# Patient Record
Sex: Female | Born: 1985 | Race: White | Hispanic: No | Marital: Single | Smoking: Current every day smoker
Health system: Southern US, Community
[De-identification: ages and names within clinical notes are randomized; demographics above are authoritative.]

## PROBLEM LIST (undated history)

## (undated) DIAGNOSIS — F4310 Post-traumatic stress disorder, unspecified: Secondary | ICD-10-CM

## (undated) DIAGNOSIS — F419 Anxiety disorder, unspecified: Secondary | ICD-10-CM

## (undated) DIAGNOSIS — F609 Personality disorder, unspecified: Secondary | ICD-10-CM

## (undated) DIAGNOSIS — F329 Major depressive disorder, single episode, unspecified: Secondary | ICD-10-CM

## (undated) DIAGNOSIS — F909 Attention-deficit hyperactivity disorder, unspecified type: Secondary | ICD-10-CM

## (undated) DIAGNOSIS — F319 Bipolar disorder, unspecified: Secondary | ICD-10-CM

## (undated) DIAGNOSIS — F32A Depression, unspecified: Secondary | ICD-10-CM

## (undated) DIAGNOSIS — E119 Type 2 diabetes mellitus without complications: Secondary | ICD-10-CM

## (undated) DIAGNOSIS — F209 Schizophrenia, unspecified: Secondary | ICD-10-CM

## (undated) DIAGNOSIS — R569 Unspecified convulsions: Secondary | ICD-10-CM

## (undated) HISTORY — DX: Major depressive disorder, single episode, unspecified: F32.9

## (undated) HISTORY — DX: Type 2 diabetes mellitus without complications: E11.9

## (undated) HISTORY — DX: Personality disorder, unspecified: F60.9

## (undated) HISTORY — DX: Bipolar disorder, unspecified: F31.9

## (undated) HISTORY — DX: Anxiety disorder, unspecified: F41.9

## (undated) HISTORY — DX: Attention-deficit hyperactivity disorder, unspecified type: F90.9

## (undated) HISTORY — DX: Post-traumatic stress disorder, unspecified: F43.10

## (undated) HISTORY — DX: Unspecified convulsions: R56.9

## (undated) HISTORY — DX: Depression, unspecified: F32.A

## (undated) HISTORY — DX: Schizophrenia, unspecified: F20.9

---
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
## 2009-02-05 ENCOUNTER — Ambulatory Visit: Payer: Self-pay | Admitting: Diagnostic Radiology

## 2009-02-05 ENCOUNTER — Emergency Department (HOSPITAL_BASED_OUTPATIENT_CLINIC_OR_DEPARTMENT_OTHER): Admission: EM | Admit: 2009-02-05 | Discharge: 2009-02-05 | Payer: Self-pay | Admitting: Emergency Medicine

## 2009-04-09 ENCOUNTER — Ambulatory Visit: Payer: Self-pay | Admitting: Diagnostic Radiology

## 2009-04-09 ENCOUNTER — Emergency Department (HOSPITAL_BASED_OUTPATIENT_CLINIC_OR_DEPARTMENT_OTHER): Admission: EM | Admit: 2009-04-09 | Discharge: 2009-04-09 | Payer: Self-pay | Admitting: Emergency Medicine

## 2009-04-13 ENCOUNTER — Ambulatory Visit: Payer: Self-pay | Admitting: Diagnostic Radiology

## 2009-04-13 ENCOUNTER — Emergency Department (HOSPITAL_BASED_OUTPATIENT_CLINIC_OR_DEPARTMENT_OTHER): Admission: EM | Admit: 2009-04-13 | Discharge: 2009-04-13 | Payer: Self-pay | Admitting: Emergency Medicine

## 2009-05-14 ENCOUNTER — Emergency Department (HOSPITAL_BASED_OUTPATIENT_CLINIC_OR_DEPARTMENT_OTHER): Admission: EM | Admit: 2009-05-14 | Discharge: 2009-05-14 | Payer: Self-pay | Admitting: Emergency Medicine

## 2009-05-14 ENCOUNTER — Ambulatory Visit: Payer: Self-pay | Admitting: Interventional Radiology

## 2010-02-28 IMAGING — CR DG LUMBAR SPINE COMPLETE 4+V
5 series · 5 of 5 positions shown · non-contrast
Comparison: 04/09/2009

CLINICAL DATA: Pain post fall

LUMBAR SPINE - COMPLETE 4+ VIEW

[t l-spine a.p.]
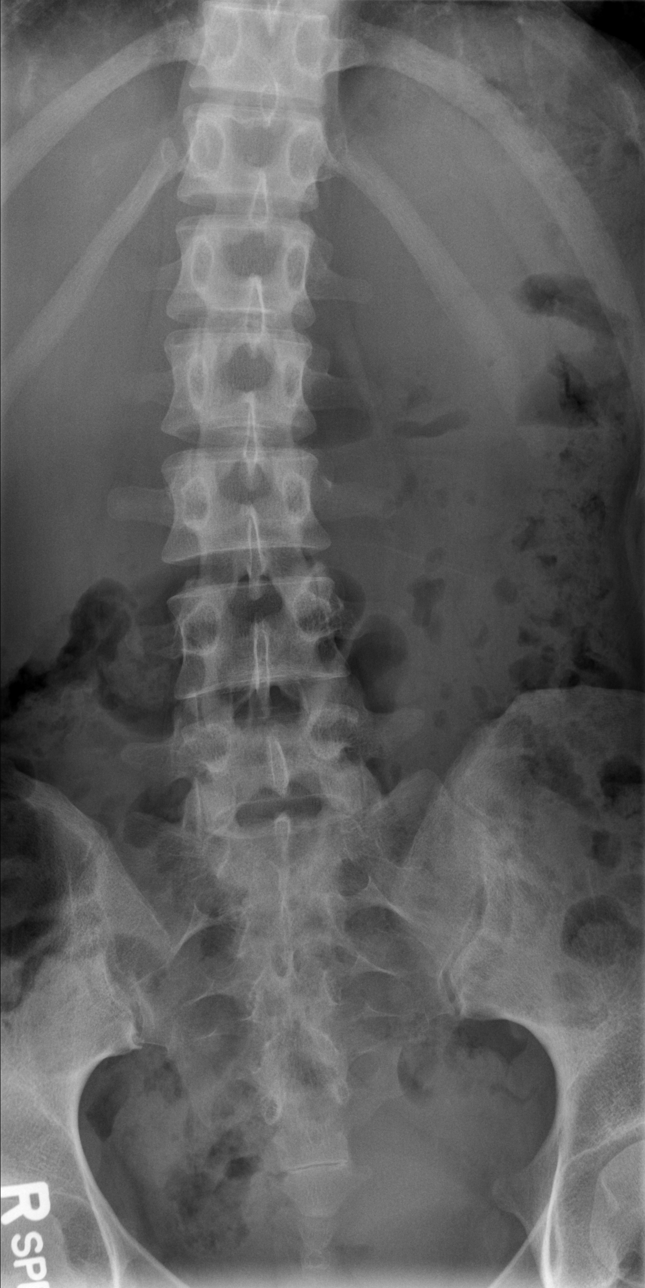

[t l-spine oblique exposure (1 of 2)]
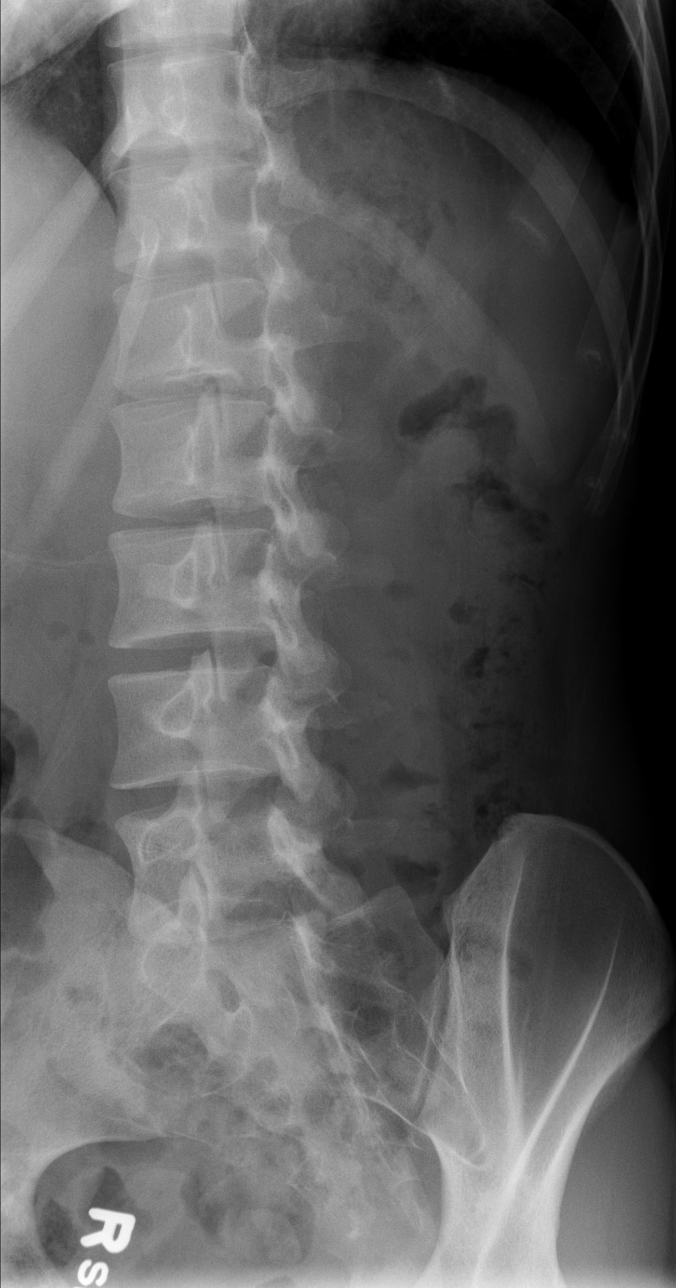

[t l-spine oblique exposure (2 of 2)]
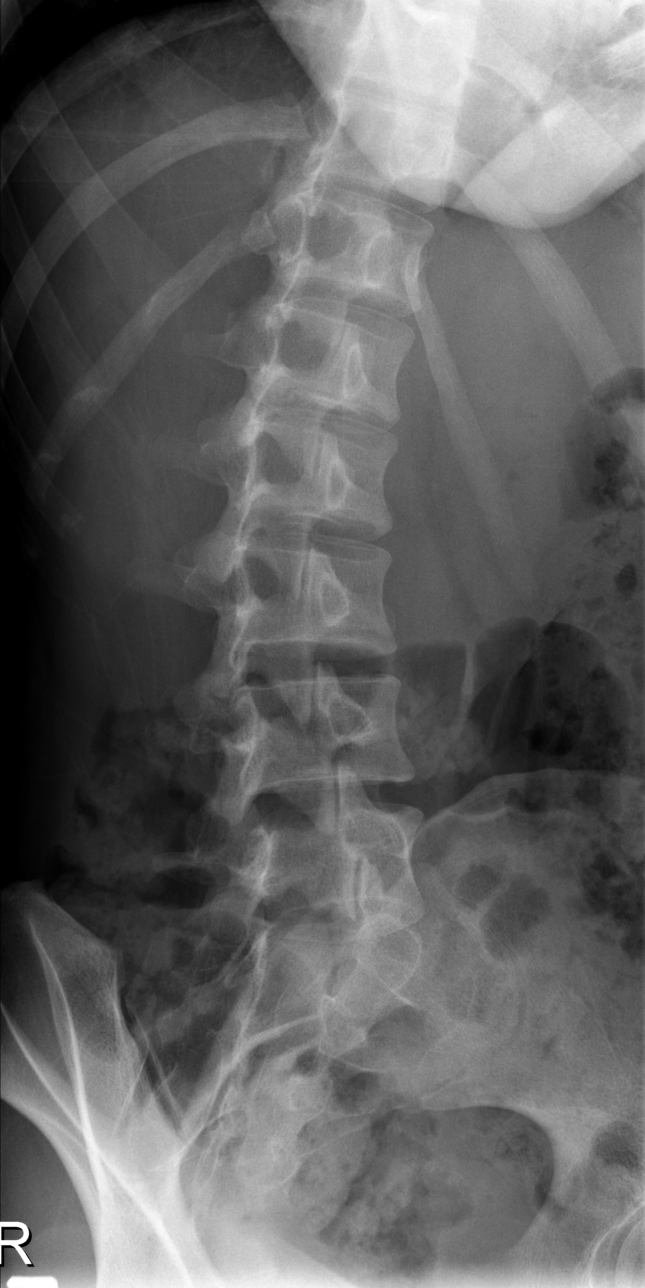

[t l-spine lat]
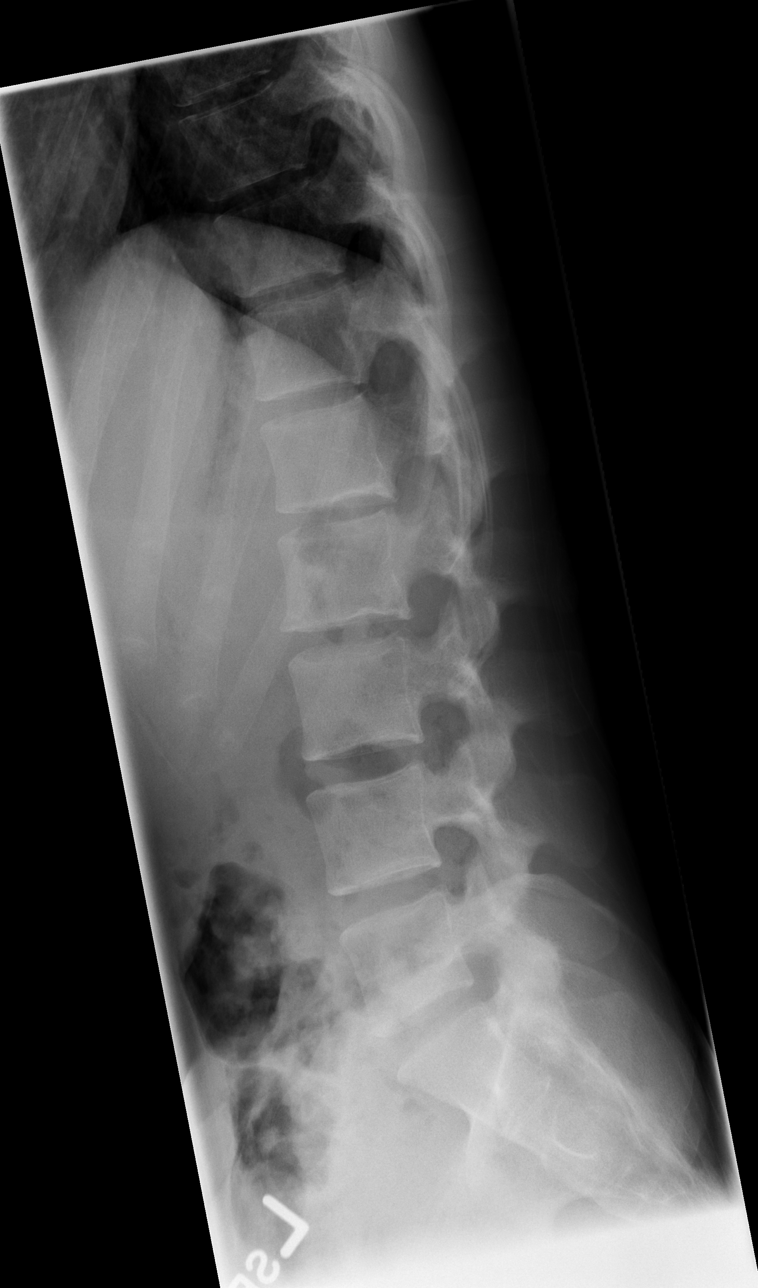

[t l-spine l5-s1 spot]
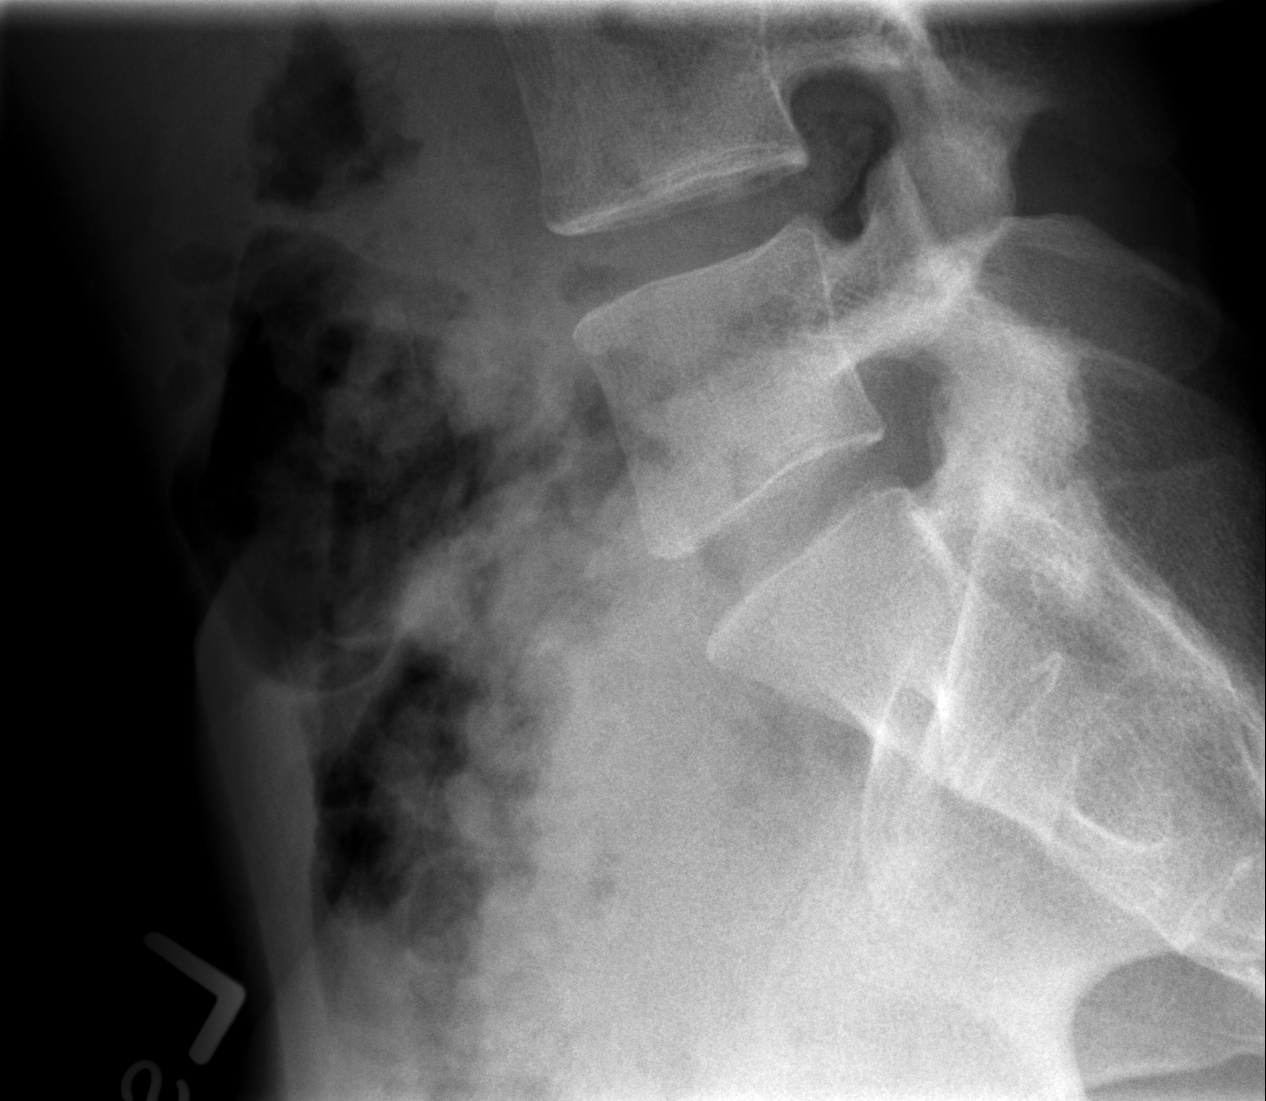

[5 of 5 positions shown; findings below may reference images not displayed]

FINDINGS: Stable mild dextroscoliosis of the lumbar spine without
evident underlying vertebral anomaly.  Vertebral body and
intervertebral disc heights well maintained throughout.  No
significant degenerative change.  Negative for fracture.
IMPRESSION: 1.  Negative for fracture or other acute bone injury.
2.  Stable mild dextroscoliosis.

## 2010-08-10 ENCOUNTER — Ambulatory Visit (HOSPITAL_COMMUNITY): Admit: 2010-08-10 | Payer: Self-pay | Admitting: Psychiatry
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
## 2016-11-12 ENCOUNTER — Encounter (HOSPITAL_COMMUNITY): Payer: Self-pay | Admitting: Psychiatry

## 2016-11-12 ENCOUNTER — Ambulatory Visit (INDEPENDENT_AMBULATORY_CARE_PROVIDER_SITE_OTHER): Payer: Self-pay | Admitting: Psychiatry

## 2016-11-12 VITALS — BP 116/70 | HR 88 | Resp 16 | Ht 65.5 in | Wt 119.0 lb

## 2016-11-12 DIAGNOSIS — F0630 Mood disorder due to known physiological condition, unspecified: Secondary | ICD-10-CM

## 2016-11-12 NOTE — Patient Instructions (Signed)
Does not want to complete assessment . Says she wants to be on adderall, vyvanse otherwise does not want to be charged or complete assessment.

## 2016-11-12 NOTE — Progress Notes (Addendum)
Patient ID: Leslie Rogers, female   DOB: 1986/01/22, 31 y.o.   MRN: [id number] Patient did not complete assessment. Wanted to be on adderall, vyvanse and has been taking xanax. Says that's why she is here for to get these medications, otherwise she doesn't need to be here.  No suicidal intent or ideations expressed.  Says she doesn't want to be charged either and did not complete assessment.  She has been given prior diagnosis in an encounter of high risk medication use.
